# Patient Record
Sex: Male | Born: 1995 | Race: Black or African American | Hispanic: No | Marital: Single | Smoking: Current some day smoker
Health system: Southern US, Community
[De-identification: ages and names within clinical notes are randomized; demographics above are authoritative.]

---
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
## 2014-06-07 ENCOUNTER — Ambulatory Visit (INDEPENDENT_AMBULATORY_CARE_PROVIDER_SITE_OTHER): Payer: No Typology Code available for payment source | Admitting: Pediatrics

## 2014-06-07 ENCOUNTER — Encounter: Payer: Self-pay | Admitting: Pediatrics

## 2014-06-07 VITALS — BP 112/60 | Ht 65.0 in | Wt 135.6 lb

## 2014-06-07 DIAGNOSIS — Z0000 Encounter for general adult medical examination without abnormal findings: Secondary | ICD-10-CM

## 2014-06-07 DIAGNOSIS — L708 Other acne: Secondary | ICD-10-CM

## 2014-06-07 DIAGNOSIS — L700 Acne vulgaris: Secondary | ICD-10-CM

## 2014-06-07 DIAGNOSIS — Z00129 Encounter for routine child health examination without abnormal findings: Secondary | ICD-10-CM

## 2014-06-07 MED ORDER — BENZOYL PEROXIDE 10 % EX GEL: Freq: Every day | CUTANEOUS | Status: DC

## 2014-06-07 MED ORDER — TRETINOIN 0.05 % EX CREA: TOPICAL_CREAM | Freq: Every day | CUTANEOUS | Status: DC

## 2014-06-07 NOTE — Patient Instructions (Signed)
Acne  Acne is a skin problem that causes pimples. Acne occurs when the pores in your skin get blocked. Your pores may become red, sore, and swollen (inflamed), or infected with a common skin bacterium (Propionibacterium acnes). Acne is a common skin problem. Up to 80% of people get acne at some time. Acne is especially common from the ages of 12 to 24. Acne usually goes away over time with proper treatment.  CAUSES   Your pores each contain an oil gland. The oil glands make an oily substance called sebum. Acne happens when these glands get plugged with sebum, dead skin cells, and dirt. The P. acnes bacteria that are normally found in the oil glands then multiply, causing inflammation. Acne is commonly triggered by changes in your hormones. These hormonal changes can cause the oil glands to get bigger and to make more sebum. Factors that can make acne worse include:   Hormone changes during adolescence.   Hormone changes during women's menstrual cycles.   Hormone changes during pregnancy.   Oil-based cosmetics and hair products.   Harshly scrubbing the skin.   Strong soaps.   Stress.   Hormone problems due to certain diseases.   Long or oily hair rubbing against the skin.   Certain medicines.   Pressure from headbands, backpacks, or shoulder pads.   Exposure to certain oils and chemicals.  SYMPTOMS   Acne often occurs on the face, neck, chest, and upper back. Symptoms include:   Small, red bumps (pimples or papules).   Whiteheads (closed comedones).   Blackheads (open comedones).   Small, pus-filled pimples (pustules).   Big, red pimples or pustules that feel tender.  More severe acne can cause:   An infected area that contains a collection of pus (abscess).   Hard, painful, fluid-filled sacs (cysts).   Scars.  DIAGNOSIS   Your caregiver can usually tell what the problem is by doing a physical exam.  TREATMENT   There are many good treatments for acne. Some are available over the counter and some  are available with a prescription. The treatment that is best for you depends on the type of acne you have and how severe it is. It may take 2 months of treatment before your acne gets better. Common treatments include:   Creams and lotions that prevent oil glands from clogging.   Creams and lotions that treat or prevent infections and inflammation.   Antibiotics applied to the skin or taken as a pill.   Pills that decrease sebum production.   Birth control pills.   Light or laser treatments.   Minor surgery.   Injections of medicine into the affected areas.   Chemicals that cause peeling of the skin.  HOME CARE INSTRUCTIONS   Good skin care is the most important part of treatment.   Wash your skin gently at least twice a day and after exercise. Always wash your skin before bed.   Use mild soap.   After each wash, apply a water-based skin moisturizer.   Keep your hair clean and off of your face. Shampoo your hair daily.   Only take medicines as directed by your caregiver.   Use a sunscreen or sunblock with SPF 30 or greater. This is especially important when you are using acne medicines.   Choose cosmetics that are noncomedogenic. This means they do not plug the oil glands.   Avoid leaning your chin or forehead on your hands.   Avoid wearing tight headbands or hats.     Avoid picking or squeezing your pimples. This can make your acne worse and cause scarring.  SEEK MEDICAL CARE IF:    Your acne is not better after 8 weeks.   Your acne gets worse.   You have a large area of skin that is red or tender.  Document Released: 10/10/2000 Document Revised: 02/27/2014 Document Reviewed: 08/01/2011  ExitCare Patient Information 2015 [employer]. This information is not intended to replace advice given to you by your health care provider. Make sure you discuss any questions you have with your health care provider.

## 2014-06-07 NOTE — Progress Notes (Signed)
Subjective:     History was provided by the patient.  Sean Harding is a 18 y.o. male who is here for this well-child visit.  Immunization History  Administered Date(s) Administered  . DTaP 10/05/1995, 12/07/1995, 01/27/1996, 08/17/1996, 06/24/2000  . Hepatitis B 01/06/95, 12/07/1995, 08/17/1996  . HiB (PRP-OMP) 10/05/1995, 12/07/1995, 01/27/1996, 08/17/1996  . IPV 10/05/1995, 12/07/1995, 08/17/1996, 06/24/2000  . Influenza Whole 11/16/2006  . MMR 08/17/1996, 06/24/2000  . Pneumococcal Conjugate-13 06/24/1999  . Td 06/22/2007  . Tdap 06/22/2007  . Varicella 06/24/2000   The following portions of the patient's history were reviewed and updated as appropriate: allergies, current medications, past family history, past medical history, past social history, past surgical history and problem list.  Current Issues: Current concerns include acne. Currently menstruating? not applicable Sexually active? no  Does patient snore? no   Review of Nutrition: Current diet: Regular Balanced diet? yes  Social Screening:  Parental relations: Good  Discipline concerns? no Concerns regarding behavior with peers? no     Objective:     Filed Vitals:   06/07/14 1359  BP: 112/60  Height: 5' 5" (1.651 m)  Weight: 135 lb 9.6 oz (61.508 kg)   Growth parameters are noted and are appropriate for age.  General:   alert and cooperative  Gait:   normal  Skin:   Open and closed comedones face   Oral cavity:   lips, mucosa, and tongue normal; teeth and gums normal  Eyes:   sclerae white, pupils equal and reactive  Ears:   normal bilaterally  Neck:   no adenopathy, supple, symmetrical, trachea midline and thyroid not enlarged, symmetric, no tenderness/mass/nodules  Lungs:  clear to auscultation bilaterally  Heart:   regular rate and rhythm, S1, S2 normal, no murmur, click, rub or gallop  Abdomen:  soft, non-tender; bowel sounds normal; no masses,  no organomegaly  GU:  exam deferred   Tanner Stage:   5   Extremities:  extremities normal, atraumatic, no cyanosis or edema  Neuro:  normal without focal findings, mental status, speech normal, alert and oriented x3 and PERLA     Assessment:    Well adolescent.    Plan:    1. Anticipatory guidance discussed. Gave handout on well-child issues at this age.  2.  Weight management:  The patient was counseled regarding nutrition and physical activity.  3. Development: appropriate for age  59. Immunizations today: per orders. History of previous adverse reactions to immunizations? no  5. Follow-up visit in 1 year for next well child visit, or sooner as needed.   6. Prescribed 2 medications for acne  7. Does not want immunizations today as he is alone and will talk to mother

## 2014-06-13 ENCOUNTER — Other Ambulatory Visit: Payer: Self-pay | Admitting: *Deleted

## 2014-06-13 NOTE — Telephone Encounter (Signed)
Medication change on Benzoyl peroxide gel max 28gm

## 2015-04-29 ENCOUNTER — Encounter (HOSPITAL_COMMUNITY): Payer: Self-pay | Admitting: *Deleted

## 2015-04-29 ENCOUNTER — Emergency Department (HOSPITAL_COMMUNITY)
Admission: EM | Admit: 2015-04-29 | Discharge: 2015-04-29 | Disposition: A | Discharge status: Home / Self Care | Payer: Medicaid Other | Attending: Emergency Medicine | Admitting: Emergency Medicine

## 2015-04-29 DIAGNOSIS — Y998 Other external cause status: Secondary | ICD-10-CM | POA: Diagnosis not present

## 2015-04-29 DIAGNOSIS — S3991XA Unspecified injury of abdomen, initial encounter: Secondary | ICD-10-CM | POA: Insufficient documentation

## 2015-04-29 DIAGNOSIS — Y9355 Activity, bike riding: Secondary | ICD-10-CM | POA: Diagnosis not present

## 2015-04-29 DIAGNOSIS — S30810A Abrasion of lower back and pelvis, initial encounter: Secondary | ICD-10-CM | POA: Insufficient documentation

## 2015-04-29 DIAGNOSIS — S3992XA Unspecified injury of lower back, initial encounter: Secondary | ICD-10-CM | POA: Diagnosis present

## 2015-04-29 DIAGNOSIS — Z79899 Other long term (current) drug therapy: Secondary | ICD-10-CM | POA: Diagnosis not present

## 2015-04-29 DIAGNOSIS — Y92410 Unspecified street and highway as the place of occurrence of the external cause: Secondary | ICD-10-CM | POA: Insufficient documentation

## 2015-04-29 MED ORDER — BACITRACIN ZINC 500 UNIT/GM EX OINT
TOPICAL_OINTMENT | CUTANEOUS | Status: AC
  Filled 2015-04-29: qty 0.9

## 2015-04-29 MED ORDER — BACITRACIN 500 UNIT/GM EX OINT
1.0000 "application " | TOPICAL_OINTMENT | Freq: Two times a day (BID) | CUTANEOUS | Status: DC
  Administered 2015-04-29: 1 via TOPICAL
  Filled 2015-04-29 (×3): qty 0.9

## 2015-04-29 MED ORDER — NAPROXEN 500 MG PO TABS: 500.0000 mg | ORAL_TABLET | Freq: Two times a day (BID) | ORAL | Status: DC

## 2015-04-29 NOTE — ED Notes (Signed)
PA Tammy Triplett at bedside. 

## 2015-04-29 NOTE — Discharge Instructions (Signed)
Abrasions An abrasion is a cut or scrape of the skin. Abrasions do not go through all layers of the skin. HOME CARE  If a bandage (dressing) was put on your wound, change it as told by your doctor. If the bandage sticks, soak it off with warm.  Wash the area with water and soap 2 times a day. Rinse off the soap. Pat the area dry with a clean towel.  Put on medicated cream (ointment) as told by your doctor.  Change your bandage right away if it gets wet or dirty.  Only take medicine as told by your doctor.  See your doctor within 24-48 hours to get your wound checked.  Check your wound for redness, puffiness (swelling), or yellowish-white fluid (pus). GET HELP RIGHT AWAY IF:   You have more pain in the wound.  You have redness, swelling, or tenderness around the wound.  You have pus coming from the wound.  You have a fever or lasting symptoms for more than 2-3 days.  You have a fever and your symptoms suddenly get worse.  You have a bad smell coming from the wound or bandage. MAKE SURE YOU:   Understand these instructions.  Will watch your condition.  Will get help right away if you are not doing well or get worse. Document Released: 03/31/2008 Document Revised: 07/07/2012 Document Reviewed: 09/16/2011 ExitCare Patient Information 2015 [employer]. This information is not intended to replace advice given to you by your health care provider. Make sure you discuss any questions you have with your health care provider.  
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
## 2015-04-29 NOTE — ED Notes (Signed)
Applied bacitracin and xeroform to pt's abrasion. Pt tolerated well.

## 2015-08-22 ENCOUNTER — Encounter (HOSPITAL_COMMUNITY): Payer: Self-pay | Admitting: Emergency Medicine

## 2015-08-22 ENCOUNTER — Emergency Department (HOSPITAL_COMMUNITY)
Admission: EM | Admit: 2015-08-22 | Discharge: 2015-08-22 | Disposition: A | Discharge status: Home / Self Care | Payer: No Typology Code available for payment source | Attending: Emergency Medicine | Admitting: Emergency Medicine

## 2015-08-22 DIAGNOSIS — Y9389 Activity, other specified: Secondary | ICD-10-CM | POA: Diagnosis not present

## 2015-08-22 DIAGNOSIS — Y92410 Unspecified street and highway as the place of occurrence of the external cause: Secondary | ICD-10-CM | POA: Diagnosis not present

## 2015-08-22 DIAGNOSIS — S4991XA Unspecified injury of right shoulder and upper arm, initial encounter: Secondary | ICD-10-CM | POA: Diagnosis not present

## 2015-08-22 DIAGNOSIS — S39012A Strain of muscle, fascia and tendon of lower back, initial encounter: Secondary | ICD-10-CM | POA: Diagnosis not present

## 2015-08-22 DIAGNOSIS — Y998 Other external cause status: Secondary | ICD-10-CM | POA: Diagnosis not present

## 2015-08-22 DIAGNOSIS — S3992XA Unspecified injury of lower back, initial encounter: Secondary | ICD-10-CM | POA: Diagnosis present

## 2015-08-22 MED ORDER — IBUPROFEN 600 MG PO TABS: 600.0000 mg | ORAL_TABLET | Freq: Four times a day (QID) | ORAL | Status: DC | PRN

## 2015-08-22 MED ORDER — METHOCARBAMOL 500 MG PO TABS: 500.0000 mg | ORAL_TABLET | Freq: Three times a day (TID) | ORAL | Status: DC
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
## 2015-08-22 NOTE — Discharge Instructions (Signed)
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
## 2016-07-06 ENCOUNTER — Ambulatory Visit (INDEPENDENT_AMBULATORY_CARE_PROVIDER_SITE_OTHER): Payer: Self-pay

## 2016-07-06 ENCOUNTER — Encounter (HOSPITAL_COMMUNITY): Payer: Self-pay | Admitting: Family Medicine

## 2016-07-06 ENCOUNTER — Ambulatory Visit (HOSPITAL_COMMUNITY)
Admission: EM | Admit: 2016-07-06 | Discharge: 2016-07-06 | Disposition: A | Discharge status: Home / Self Care | Payer: Self-pay | Attending: Family Medicine | Admitting: Family Medicine

## 2016-07-06 DIAGNOSIS — S63502A Unspecified sprain of left wrist, initial encounter: Secondary | ICD-10-CM
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
## 2016-07-06 NOTE — Discharge Instructions (Signed)
X-rays of the wrist are negative. The diagnosis is a wrist sprain. Keep the wrist in splint for the next 7 days except when bathing. He wrist should feel normal when it comes off next Sunday, otherwise follow-up with your personal doctor.

## 2017-06-28 ENCOUNTER — Other Ambulatory Visit: Payer: Self-pay

## 2017-06-28 ENCOUNTER — Emergency Department (HOSPITAL_COMMUNITY)
Admission: EM | Admit: 2017-06-28 | Discharge: 2017-06-28 | Disposition: A | Discharge status: Home / Self Care | Payer: BLUE CROSS/BLUE SHIELD | Attending: Emergency Medicine | Admitting: Emergency Medicine

## 2017-06-28 ENCOUNTER — Encounter (HOSPITAL_COMMUNITY): Payer: Self-pay | Admitting: *Deleted

## 2017-06-28 ENCOUNTER — Emergency Department (HOSPITAL_COMMUNITY): Payer: BLUE CROSS/BLUE SHIELD

## 2017-06-28 DIAGNOSIS — R0789 Other chest pain: Secondary | ICD-10-CM | POA: Diagnosis not present

## 2017-06-28 DIAGNOSIS — F17200 Nicotine dependence, unspecified, uncomplicated: Secondary | ICD-10-CM | POA: Insufficient documentation

## 2017-06-28 DIAGNOSIS — R079 Chest pain, unspecified: Secondary | ICD-10-CM | POA: Diagnosis present

## 2017-06-28 LAB — BASIC METABOLIC PANEL
Anion gap: 8 (ref 5–15)
BUN: 17 mg/dL (ref 6–20)
CALCIUM: 9.3 mg/dL (ref 8.9–10.3)
CO2: 26 mmol/L (ref 22–32)
CREATININE: 0.91 mg/dL (ref 0.61–1.24)
Chloride: 105 mmol/L (ref 101–111)
GFR calc Af Amer: >60 mL/min (ref >60)
GLUCOSE: 91 mg/dL (ref 65–99)
Potassium: 4.1 mmol/L (ref 3.5–5.1)
SODIUM: 139 mmol/L (ref 135–145)

## 2017-06-28 LAB — D-DIMER, QUANTITATIVE: D-Dimer, Quant: <0.27 ug/mL-FEU (ref 0.00–0.50)

## 2017-06-28 LAB — CBC
HCT: 40.2 % (ref 39.0–52.0)
HEMOGLOBIN: 14 g/dL (ref 13.0–17.0)
MCH: 31.3 pg (ref 26.0–34.0)
MCHC: 34.8 g/dL (ref 30.0–36.0)
MCV: 89.7 fL (ref 78.0–100.0)
PLATELETS: 305 10*3/uL (ref 150–400)
RBC: 4.48 MIL/uL (ref 4.22–5.81)
RDW: 11.8 % (ref 11.5–15.5)
WBC: 4.5 10*3/uL (ref 4.0–10.5)

## 2017-06-28 LAB — TROPONIN I: Troponin I: <0.03 ng/mL (ref <0.03)

## 2017-06-28 MED ORDER — METHOCARBAMOL 500 MG PO TABS: 1000.0000 mg | ORAL_TABLET | Freq: Four times a day (QID) | ORAL | 0 refills | Status: DC | PRN

## 2017-06-28 MED ORDER — ACETAMINOPHEN 325 MG PO TABS
650.0000 mg | ORAL_TABLET | Freq: Once | ORAL | Status: AC
  Administered 2017-06-28: 650 mg via ORAL
  Filled 2017-06-28: qty 2

## 2017-06-28 MED ORDER — IBUPROFEN 400 MG PO TABS
400.0000 mg | ORAL_TABLET | Freq: Once | ORAL | Status: AC
  Administered 2017-06-28: 400 mg via ORAL
  Filled 2017-06-28: qty 1

## 2017-06-28 MED ORDER — NAPROXEN 250 MG PO TABS: 250.0000 mg | ORAL_TABLET | Freq: Two times a day (BID) | ORAL | 0 refills | Status: DC | PRN

## 2017-06-28 NOTE — Discharge Instructions (Signed)
Take the prescriptions as directed. May also take over the counter tylenol, as directed on packaging, as needed for discomfort.  Apply moist heat or ice to the area(s) of discomfort, for 15 minutes at a time, several times per day for the next few days.  Do not fall asleep on a heating or ice pack.  Call your regular medical doctor on Tuesday to schedule a follow up appointment this week.  Return to the Emergency Department immediately if worsening.

## 2017-06-28 NOTE — ED Provider Notes (Signed)
[facility]EM[facility]ENCY DEPT Provider Note   CSN: [id number] Arrival date & time: 06/28/17  1345     History   Chief Complaint Chief Complaint  Patient presents with  . Chest Pain    HPI Sean Harding is a 21 y.o. male.  HPI  Pt was seen at 1620. Per pt, c/o gradual onset and persistence of constant left sided chest "pain" for the past 3 days. Describes the CP as "stabbing" and "sore," constant, worsens with movement of his left arm and palpation of the area. Denies any other symptoms. Denies injury, no SOB/cough, no palpitations, no fevers, no abd pain, no N/V/D, no back pain, no rash.   History reviewed. No pertinent past medical history.  Patient Active Problem List   Diagnosis Date Noted  . 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 FRACTURE, RADIUS, DISTAL 06/27/2008    History reviewed. No pertinent surgical history.     Home Medications    Prior to Admission medications   Not on File    Family History Family History  Problem Relation Age of Onset  . Heart attack Other        Great Aunt: death/late 40's  . Heart attack Other        Great Grandmother: death/late 55's    Social History Social History  Substance Use Topics  . Smoking status: Current Some Day Smoker  . Smokeless tobacco: Never Used  . Alcohol use Yes     Comment: sometimes     Allergies   Bee venom   Review of Systems Review of Systems ROS: Statement: All systems negative except as marked or noted in the HPI; Constitutional: Negative for fever and chills. ; ; Eyes: Negative for eye pain, redness and discharge. ; ; ENMT: Negative for ear pain, hoarseness, nasal congestion, sinus pressure and sore throat. ; ; Cardiovascular: Negative for palpitations, diaphoresis, dyspnea and peripheral edema. ; ; Respiratory: Negative for cough, wheezing and stridor. ; ; Gastrointestinal: Negative for nausea, vomiting, diarrhea, abdominal pain, blood in stool, hematemesis, jaundice and rectal bleeding. . ; ;  Genitourinary: Negative for dysuria, flank pain and hematuria. ; ; Musculoskeletal: +chest wall pain. Negative for back pain and neck pain. Negative for swelling and trauma.; ; Skin: Negative for pruritus, rash, abrasions, blisters, bruising and skin lesion.; ; Neuro: Negative for headache, lightheadedness and neck stiffness. Negative for weakness, altered level of consciousness, altered mental status, extremity weakness, paresthesias, involuntary movement, seizure and syncope.      Physical Exam Updated Vital Signs BP 126/74 (BP Location: Right Arm)   Pulse 64   Temp 98.3 F (36.8 C) (Oral)   Resp 18   Ht 5\' 5"  (1.651 m)   Wt 63.5 kg (140 lb)   SpO2 100%   BMI 23.30 kg/m   Physical Exam 1625: Physical examination:  Nursing notes reviewed; Vital signs and O2 SAT reviewed;  Constitutional: Well developed, Well nourished, Well hydrated, In no acute distress; Head:  Normocephalic, atraumatic; Eyes: EOMI, PERRL, No scleral icterus; ENMT: Mouth and pharynx normal, Mucous membranes moist; Neck: Supple, Full range of motion, No lymphadenopathy; Cardiovascular: Regular rate and rhythm, No gallop; Respiratory: Breath sounds clear & equal bilaterally, No wheezes.  Speaking full sentences with ease, Normal respiratory effort/excursion; Chest: +TTP left parasternal and medial chest wall areas which reproduce pt's symptoms. No rash, no deformity, no soft tissue crepitus. Movement normal; Abdomen: Soft, Nontender, Nondistended, Normal bowel sounds; Genitourinary: No CVA tenderness; Extremities: Pulses normal, No tenderness, No edema, No calf edema or  asymmetry.; Neuro: AA&Ox3, Major CN grossly intact.  Speech clear. No gross focal motor or sensory deficits in extremities.; Skin: Color normal, Warm, Dry.   ED Treatments / Results  Labs (all labs ordered are listed, but only abnormal results are displayed)   EKG  EKG Interpretation  Date/Time:  Sunday June 28 2017 13:57:26 EDT Ventricular Rate:    68 PR Interval:  152 QRS Duration: 90 QT Interval:  344 QTC Calculation: 365 R Axis:   90 Text Interpretation:  Normal sinus rhythm Rightward axis Moderate voltage criteria for LVH, may be normal variant Cannot rule out Septal infarct , age undetermined No old tracing to compare Confirmed by Samuel JesterMcManus, Donte Lenzo [phone]([id number]) on 06/28/2017 4:21:14 PM       Radiology   Procedures Procedures (including critical care time)  Medications Ordered in ED Medications  acetaminophen (TYLENOL) tablet 650 mg (not administered)  ibuprofen (ADVIL,MOTRIN) tablet 400 mg (not administered)     Initial Impression / Assessment and Plan / ED Course  I have reviewed the triage vital signs and the nursing notes.  Pertinent labs & imaging results that were available during my care of the patient were reviewed by me and considered in my medical decision making (see chart for details).  MDM Reviewed: previous chart, nursing note and vitals Interpretation: labs, ECG and x-ray   Results for orders placed or performed during the hospital encounter of 06/28/17  Basic metabolic panel  Result Value Ref Range   Sodium 139 135 - 145 mmol/L   Potassium 4.1 3.5 - 5.1 mmol/L   Chloride 105 101 - 111 mmol/L   CO2 26 22 - 32 mmol/L   Glucose, Bld 91 65 - 99 mg/dL   BUN 17 6 - 20 mg/dL   Creatinine, Ser 1.910.91 0.61 - 1.24 mg/dL   Calcium 9.3 8.9 - 47.810.3 mg/dL   GFR calc non Af Amer >60 >60 mL/min   GFR calc Af Amer >60 >60 mL/min   Anion gap 8 5 - 15  CBC  Result Value Ref Range   WBC 4.5 4.0 - 10.5 K/uL   RBC 4.48 4.22 - 5.81 MIL/uL   Hemoglobin 14.0 13.0 - 17.0 g/dL   HCT 29.540.2 62.139.0 - 30.852.0 %   MCV 89.7 78.0 - 100.0 fL   MCH 31.3 26.0 - 34.0 pg   MCHC 34.8 30.0 - 36.0 g/dL   RDW 65.711.8 84.611.5 - 96.215.5 %   Platelets 305 150 - 400 K/uL  Troponin I  Result Value Ref Range   Troponin I <0.03 <0.03 ng/mL  Troponin I  Result Value Ref Range   Troponin I <0.03 <0.03 ng/mL  D-dimer, quantitative  Result Value Ref  Range   D-Dimer, Quant <0.27 0.00 - 0.50 ug/mL-FEU   Dg Chest 2 View Result Date: 06/28/2017 CLINICAL DATA:  21 year old with three-day history of left-sided chest pain. Current smoker. EXAM: CHEST  2 VIEW COMPARISON:  06/05/2010, 11/21/2007. FINDINGS: Cardiomediastinal silhouette unremarkable. Lungs clear. Bronchovascular markings normal. Pulmonary vascularity normal. No pneumothorax. No pleural effusions. Visualized bony thorax intact. IMPRESSION: Normal examination. Electronically Signed   By: Hulan Saashomas  Lawrence M.D.   On: 06/28/2017 14:07    1725:  Doubt PE as cause for symptoms with normal d-dimer and low risk [place].  Doubt ACS as cause for symptoms with normal troponin x2 and EKG without acute STTW changes after 3 days of constant symptoms. Pt feels better after tylenol and motrin and wants to go home now. Dx and testing d/w pt.  Questions answered.  Verb understanding, agreeable to d/c home with outpt f/u.    Final Clinical Impressions(s) / ED Diagnoses   Final diagnoses:  None    New Prescriptions New Prescriptions   No medications on file     Samuel Jester, DO 07/01/17 2122

## 2017-06-28 NOTE — ED Triage Notes (Signed)
Pt c/o left sided chest pain x 3 days. Denies SOB, n/v, dizziness.

## 2018-05-30 IMAGING — DX DG CHEST 2V
2 series · 2 of 2 positions shown · non-contrast
Comparison: 06/05/2010, 11/21/2007.

CLINICAL DATA: 20-year-old with three-day history of left-sided
chest pain. Current smoker.

EXAM:
CHEST  2 VIEW

[chest pa]
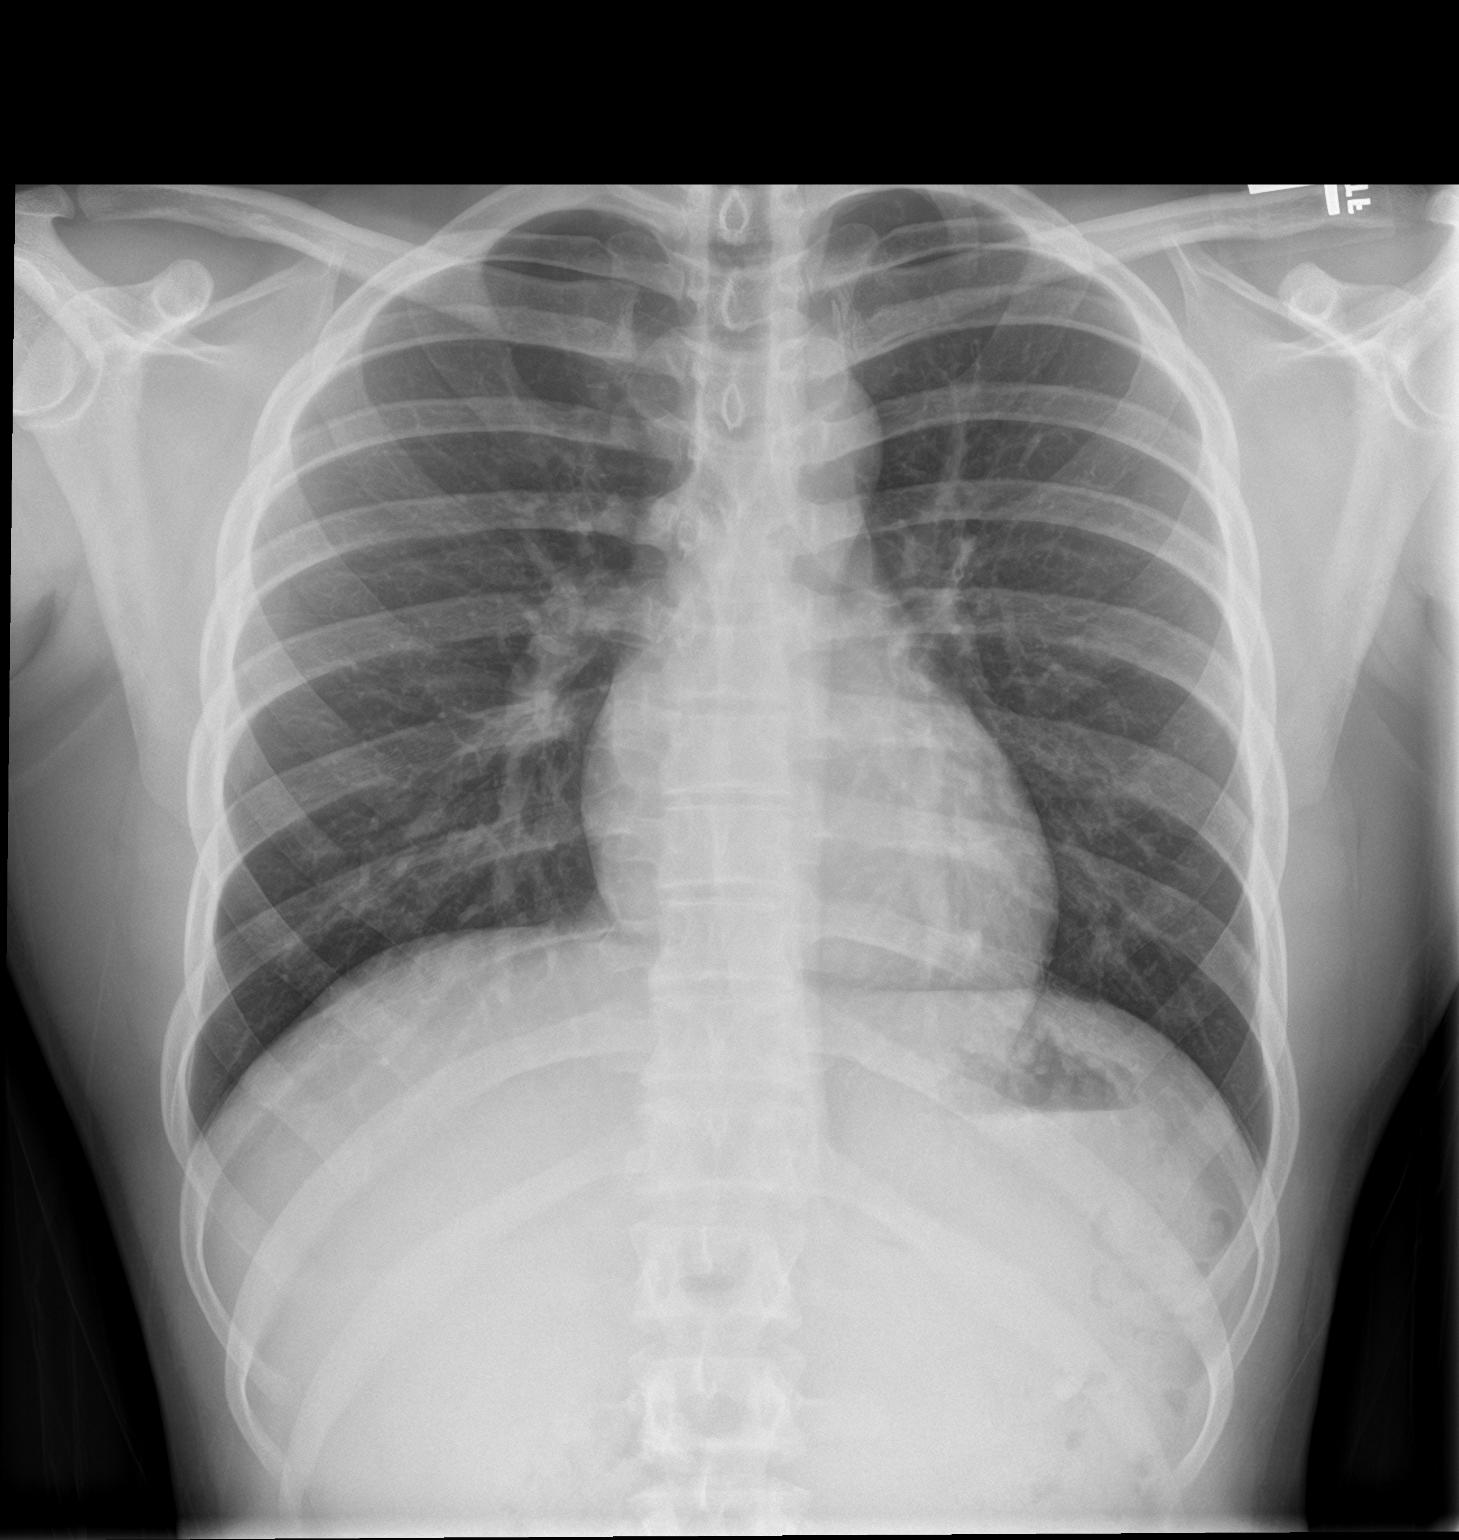

[chest lat]
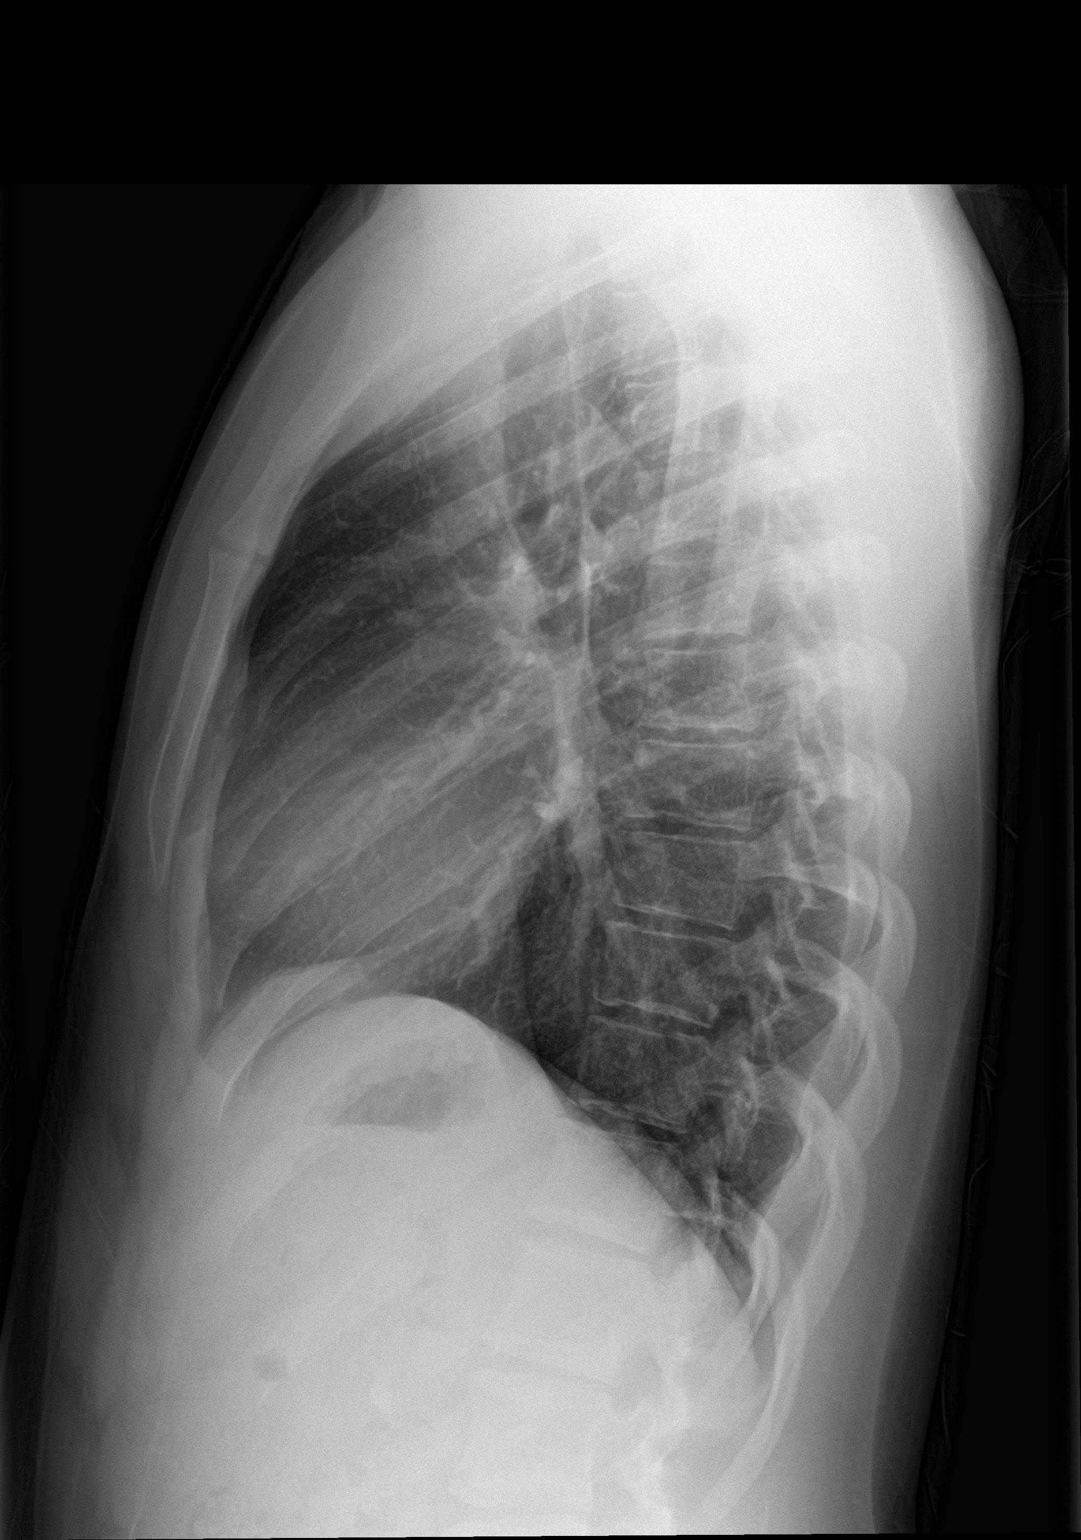

[2 of 2 positions shown; findings below may reference images not displayed]

FINDINGS: Cardiomediastinal silhouette unremarkable. Lungs clear.
Bronchovascular markings normal. Pulmonary vascularity normal. No
pneumothorax. No pleural effusions. Visualized bony thorax intact.
IMPRESSION: Normal examination.

## 2023-05-11 ENCOUNTER — Other Ambulatory Visit: Payer: Self-pay

## 2023-05-11 ENCOUNTER — Observation Stay (HOSPITAL_COMMUNITY)
Admission: EM | Admit: 2023-05-11 | Discharge: 2023-05-12 | Disposition: A | Discharge status: Home / Self Care | Payer: Commercial Managed Care - HMO | Attending: General Surgery | Admitting: General Surgery

## 2023-05-11 DIAGNOSIS — R1031 Right lower quadrant pain: Secondary | ICD-10-CM | POA: Diagnosis present

## 2023-05-11 DIAGNOSIS — K3580 Unspecified acute appendicitis: Secondary | ICD-10-CM | POA: Diagnosis not present

## 2023-05-11 DIAGNOSIS — F17200 Nicotine dependence, unspecified, uncomplicated: Secondary | ICD-10-CM | POA: Insufficient documentation

## 2023-05-11 NOTE — ED Triage Notes (Signed)
Pt c/o lower abdominal pain that started yesterday. Believes he may be constipated as he has not had a normal bowel movement since Friday. States he has taken stool softeners and a laxative today with no relief.

## 2023-05-12 ENCOUNTER — Emergency Department (HOSPITAL_COMMUNITY): Payer: Commercial Managed Care - HMO | Admitting: Anesthesiology

## 2023-05-12 ENCOUNTER — Emergency Department (HOSPITAL_COMMUNITY): Payer: Commercial Managed Care - HMO

## 2023-05-12 ENCOUNTER — Emergency Department (HOSPITAL_BASED_OUTPATIENT_CLINIC_OR_DEPARTMENT_OTHER): Payer: Commercial Managed Care - HMO | Admitting: Anesthesiology

## 2023-05-12 ENCOUNTER — Encounter (HOSPITAL_COMMUNITY)
Admission: EM | Disposition: A | Discharge status: Home / Self Care | Payer: Self-pay | Source: Home / Self Care | Attending: Emergency Medicine

## 2023-05-12 ENCOUNTER — Encounter (HOSPITAL_COMMUNITY): Payer: Self-pay

## 2023-05-12 ENCOUNTER — Other Ambulatory Visit: Payer: Self-pay

## 2023-05-12 DIAGNOSIS — K353 Acute appendicitis with localized peritonitis, without perforation or gangrene: Secondary | ICD-10-CM | POA: Diagnosis not present

## 2023-05-12 DIAGNOSIS — K3580 Unspecified acute appendicitis: Principal | ICD-10-CM

## 2023-05-12 HISTORY — PX: XI ROBOTIC LAPAROSCOPIC ASSISTED APPENDECTOMY: SHX6877

## 2023-05-12 LAB — COMPREHENSIVE METABOLIC PANEL
ALT: 11 U/L (ref 0–44)
AST: 23 U/L (ref 15–41)
Albumin: 4.3 g/dL (ref 3.5–5.0)
Alkaline Phosphatase: 67 U/L (ref 38–126)
Anion gap: 9 (ref 5–15)
BUN: 11 mg/dL (ref 6–20)
CO2: 26 mmol/L (ref 22–32)
Calcium: 9.4 mg/dL (ref 8.9–10.3)
Chloride: 101 mmol/L (ref 98–111)
Creatinine, Ser: 0.92 mg/dL (ref 0.61–1.24)
GFR, Estimated: >60 mL/min (ref >60)
Glucose, Bld: 110 mg/dL — ABNORMAL HIGH (ref 70–99)
Potassium: 3.6 mmol/L (ref 3.5–5.1)
Sodium: 136 mmol/L (ref 135–145)
Total Bilirubin: 0.9 mg/dL (ref 0.3–1.2)
Total Protein: 7.8 g/dL (ref 6.5–8.1)

## 2023-05-12 LAB — CBC WITH DIFFERENTIAL/PLATELET
Abs Immature Granulocytes: 0.02 10*3/uL (ref 0.00–0.07)
Basophils Absolute: 0 10*3/uL (ref 0.0–0.1)
Basophils Relative: 0 %
Eosinophils Absolute: 0.2 10*3/uL (ref 0.0–0.5)
Eosinophils Relative: 2 %
HCT: 41.3 % (ref 39.0–52.0)
Hemoglobin: 13.8 g/dL (ref 13.0–17.0)
Immature Granulocytes: 0 %
Lymphocytes Relative: 9 %
Lymphs Abs: 0.9 10*3/uL (ref 0.7–4.0)
MCH: 30.6 pg (ref 26.0–34.0)
MCHC: 33.4 g/dL (ref 30.0–36.0)
MCV: 91.6 fL (ref 80.0–100.0)
Monocytes Absolute: 0.5 10*3/uL (ref 0.1–1.0)
Monocytes Relative: 6 %
Neutro Abs: 8.1 10*3/uL — ABNORMAL HIGH (ref 1.7–7.7)
Neutrophils Relative %: 83 %
Platelets: 275 10*3/uL (ref 150–400)
RBC: 4.51 MIL/uL (ref 4.22–5.81)
RDW: 11.5 % (ref 11.5–15.5)
WBC: 9.7 10*3/uL (ref 4.0–10.5)
nRBC: 0 % (ref 0.0–0.2)

## 2023-05-12 LAB — LIPASE, BLOOD: Lipase: 24 U/L (ref 11–51)

## 2023-05-12 SURGERY — APPENDECTOMY, ROBOT-ASSISTED, LAPAROSCOPIC
Anesthesia: General | Site: Abdomen

## 2023-05-12 MED ORDER — FENTANYL CITRATE (PF) 100 MCG/2ML IJ SOLN
INTRAMUSCULAR | Status: AC
  Filled 2023-05-12: qty 2

## 2023-05-12 MED ORDER — KETOROLAC TROMETHAMINE 30 MG/ML IJ SOLN: 30.0000 mg | Freq: Four times a day (QID) | INTRAMUSCULAR | Status: DC | PRN

## 2023-05-12 MED ORDER — SUGAMMADEX SODIUM 200 MG/2ML IV SOLN
INTRAVENOUS | Status: DC | PRN
  Administered 2023-05-12: 200 mg via INTRAVENOUS

## 2023-05-12 MED ORDER — DEXAMETHASONE SODIUM PHOSPHATE 10 MG/ML IJ SOLN
INTRAMUSCULAR | Status: DC | PRN
  Administered 2023-05-12: 10 mg via INTRAVENOUS

## 2023-05-12 MED ORDER — ROCURONIUM BROMIDE 100 MG/10ML IV SOLN
INTRAVENOUS | Status: DC | PRN
  Administered 2023-05-12: 60 mg via INTRAVENOUS
  Administered 2023-05-12 (×2): 10 mg via INTRAVENOUS

## 2023-05-12 MED ORDER — ACETAMINOPHEN 325 MG PO TABS: 650.0000 mg | ORAL_TABLET | Freq: Four times a day (QID) | ORAL | Status: DC | PRN

## 2023-05-12 MED ORDER — SODIUM CHLORIDE 0.9 % IR SOLN
Status: DC | PRN
  Administered 2023-05-12: 3000 mL

## 2023-05-12 MED ORDER — KETOROLAC TROMETHAMINE 30 MG/ML IJ SOLN: 30.0000 mg | Freq: Four times a day (QID) | INTRAMUSCULAR | Status: DC

## 2023-05-12 MED ORDER — DEXMEDETOMIDINE HCL IN NACL 80 MCG/20ML IV SOLN
INTRAVENOUS | Status: DC | PRN
  Administered 2023-05-12 (×2): 8 ug via INTRAVENOUS
  Administered 2023-05-12: 4 ug via INTRAVENOUS

## 2023-05-12 MED ORDER — ONDANSETRON 4 MG PO TBDP: 4.0000 mg | ORAL_TABLET | Freq: Four times a day (QID) | ORAL | Status: DC | PRN

## 2023-05-12 MED ORDER — PROPOFOL 10 MG/ML IV BOLUS
INTRAVENOUS | Status: DC | PRN
Start: 2023-05-12 — End: 2023-05-12
  Administered 2023-05-12: 25 mg via INTRAVENOUS
  Administered 2023-05-12: 175 mg via INTRAVENOUS
  Administered 2023-05-12: 50 mg via INTRAVENOUS
  Administered 2023-05-12: 25 mg via INTRAVENOUS

## 2023-05-12 MED ORDER — ALBUMIN HUMAN 5 % IV SOLN
INTRAVENOUS | Status: AC
  Filled 2023-05-12: qty 250

## 2023-05-12 MED ORDER — LACTATED RINGERS IV BOLUS
1000.0000 mL | Freq: Once | INTRAVENOUS | Status: AC
  Administered 2023-05-12: 1000 mL via INTRAVENOUS

## 2023-05-12 MED ORDER — ONDANSETRON HCL 4 MG/2ML IJ SOLN
4.0000 mg | Freq: Four times a day (QID) | INTRAMUSCULAR | Status: DC | PRN
  Administered 2023-05-12: 4 mg via INTRAVENOUS
  Filled 2023-05-12: qty 2

## 2023-05-12 MED ORDER — ACETAMINOPHEN 650 MG RE SUPP: 650.0000 mg | Freq: Four times a day (QID) | RECTAL | Status: DC | PRN

## 2023-05-12 MED ORDER — ONDANSETRON HCL 4 MG/2ML IJ SOLN
INTRAMUSCULAR | Status: DC | PRN
  Administered 2023-05-12: 4 mg via INTRAVENOUS

## 2023-05-12 MED ORDER — KETOROLAC TROMETHAMINE 30 MG/ML IJ SOLN
30.0000 mg | Freq: Once | INTRAMUSCULAR | Status: AC
  Administered 2023-05-12: 30 mg via INTRAVENOUS
  Filled 2023-05-12: qty 1

## 2023-05-12 MED ORDER — LIDOCAINE HCL 1 % IJ SOLN
INTRAMUSCULAR | Status: DC | PRN
  Administered 2023-05-12: 100 mg via INTRADERMAL

## 2023-05-12 MED ORDER — METRONIDAZOLE 500 MG/100ML IV SOLN
500.0000 mg | Freq: Once | INTRAVENOUS | Status: AC
  Administered 2023-05-12: 500 mg via INTRAVENOUS
  Filled 2023-05-12: qty 100

## 2023-05-12 MED ORDER — HYDROMORPHONE HCL 1 MG/ML IJ SOLN
0.2500 mg | INTRAMUSCULAR | Status: DC | PRN
  Administered 2023-05-12: 0.5 mg via INTRAVENOUS
  Filled 2023-05-12: qty 0.5

## 2023-05-12 MED ORDER — METRONIDAZOLE 500 MG/100ML IV SOLN
500.0000 mg | Freq: Two times a day (BID) | INTRAVENOUS | Status: DC
  Administered 2023-05-12: 500 mg via INTRAVENOUS
  Filled 2023-05-12: qty 100

## 2023-05-12 MED ORDER — LACTATED RINGERS IV SOLN: INTRAVENOUS | Status: DC

## 2023-05-12 MED ORDER — ONDANSETRON HCL 4 MG/2ML IJ SOLN: 4.0000 mg | Freq: Four times a day (QID) | INTRAMUSCULAR | Status: DC | PRN

## 2023-05-12 MED ORDER — SODIUM CHLORIDE 0.9 % IV SOLN: 2.0000 g | INTRAVENOUS | Status: DC

## 2023-05-12 MED ORDER — CHLORHEXIDINE GLUCONATE 0.12 % MT SOLN
15.0000 mL | Freq: Once | OROMUCOSAL | Status: AC
  Administered 2023-05-12: 15 mL via OROMUCOSAL
  Filled 2023-05-12: qty 15

## 2023-05-12 MED ORDER — ROCURONIUM BROMIDE 10 MG/ML (PF) SYRINGE
PREFILLED_SYRINGE | INTRAVENOUS | Status: AC
  Filled 2023-05-12: qty 10

## 2023-05-12 MED ORDER — PROPOFOL 10 MG/ML IV BOLUS
INTRAVENOUS | Status: AC
  Filled 2023-05-12: qty 20

## 2023-05-12 MED ORDER — MIDAZOLAM HCL 2 MG/2ML IJ SOLN
INTRAMUSCULAR | Status: AC
  Filled 2023-05-12: qty 2

## 2023-05-12 MED ORDER — EPHEDRINE SULFATE-NACL 50-0.9 MG/10ML-% IV SOSY
PREFILLED_SYRINGE | INTRAVENOUS | Status: DC | PRN
  Administered 2023-05-12 (×2): 10 mg via INTRAVENOUS

## 2023-05-12 MED ORDER — HYDROMORPHONE HCL 1 MG/ML IJ SOLN: 1.0000 mg | INTRAMUSCULAR | Status: DC | PRN

## 2023-05-12 MED ORDER — MORPHINE SULFATE (PF) 4 MG/ML IV SOLN
4.0000 mg | INTRAVENOUS | Status: DC | PRN
  Administered 2023-05-12: 4 mg via INTRAVENOUS
  Filled 2023-05-12: qty 1

## 2023-05-12 MED ORDER — SODIUM CHLORIDE 0.9 % IV SOLN
2.0000 g | Freq: Once | INTRAVENOUS | Status: AC
  Administered 2023-05-12: 2 g via INTRAVENOUS
  Filled 2023-05-12: qty 20

## 2023-05-12 MED ORDER — IOHEXOL 300 MG/ML  SOLN
100.0000 mL | Freq: Once | INTRAMUSCULAR | Status: AC | PRN
  Administered 2023-05-12: 100 mL via INTRAVENOUS

## 2023-05-12 MED ORDER — MEPERIDINE HCL 50 MG/ML IJ SOLN: 6.2500 mg | INTRAMUSCULAR | Status: DC | PRN

## 2023-05-12 MED ORDER — PHENYLEPHRINE 80 MCG/ML (10ML) SYRINGE FOR IV PUSH (FOR BLOOD PRESSURE SUPPORT)
PREFILLED_SYRINGE | INTRAVENOUS | Status: AC
  Filled 2023-05-12: qty 10

## 2023-05-12 MED ORDER — STERILE WATER FOR IRRIGATION IR SOLN
Status: DC | PRN
  Administered 2023-05-12: 1000 mL

## 2023-05-12 MED ORDER — OXYCODONE HCL 5 MG PO TABS: 5.0000 mg | ORAL_TABLET | ORAL | 0 refills | Status: AC | PRN

## 2023-05-12 MED ORDER — DEXAMETHASONE SODIUM PHOSPHATE 10 MG/ML IJ SOLN
INTRAMUSCULAR | Status: AC
  Filled 2023-05-12: qty 1

## 2023-05-12 MED ORDER — METRONIDAZOLE 500 MG/100ML IV SOLN: 500.0000 mg | Freq: Two times a day (BID) | INTRAVENOUS | Status: DC

## 2023-05-12 MED ORDER — BUPIVACAINE HCL (PF) 0.5 % IJ SOLN
INTRAMUSCULAR | Status: AC
  Filled 2023-05-12: qty 30

## 2023-05-12 MED ORDER — EPHEDRINE 5 MG/ML INJ
INTRAVENOUS | Status: AC
  Filled 2023-05-12: qty 5

## 2023-05-12 MED ORDER — OXYCODONE HCL 5 MG PO TABS: 5.0000 mg | ORAL_TABLET | ORAL | Status: DC | PRN

## 2023-05-12 MED ORDER — PHENYLEPHRINE HCL (PRESSORS) 10 MG/ML IV SOLN
INTRAVENOUS | Status: DC | PRN
  Administered 2023-05-12: 80 ug via INTRAVENOUS

## 2023-05-12 MED ORDER — ORAL CARE MOUTH RINSE: 15.0000 mL | Freq: Once | OROMUCOSAL | Status: AC

## 2023-05-12 MED ORDER — MIDAZOLAM HCL 5 MG/5ML IJ SOLN
INTRAMUSCULAR | Status: DC | PRN
  Administered 2023-05-12: 2 mg via INTRAVENOUS

## 2023-05-12 MED ORDER — FENTANYL CITRATE (PF) 100 MCG/2ML IJ SOLN
INTRAMUSCULAR | Status: DC | PRN
  Administered 2023-05-12 (×4): 50 ug via INTRAVENOUS

## 2023-05-12 MED ORDER — SUCCINYLCHOLINE CHLORIDE 200 MG/10ML IV SOSY
PREFILLED_SYRINGE | INTRAVENOUS | Status: AC
  Filled 2023-05-12: qty 10

## 2023-05-12 MED ORDER — BUPIVACAINE HCL (PF) 0.5 % IJ SOLN
INTRAMUSCULAR | Status: DC | PRN
  Administered 2023-05-12: 30 mL

## 2023-05-12 MED ORDER — ONDANSETRON HCL 4 MG/2ML IJ SOLN
INTRAMUSCULAR | Status: AC
  Filled 2023-05-12: qty 2

## 2023-05-12 SURGICAL SUPPLY — 58 items
ADH SKN CLS APL DERMABOND .7 (GAUZE/BANDAGES/DRESSINGS) ×1
APL PRP STRL LF DISP 70% ISPRP (MISCELLANEOUS) ×1
CANNULA REDUCER 12-8 DVNC XI (CANNULA) ×1 IMPLANT
CHLORAPREP W/TINT 26 (MISCELLANEOUS) ×1 IMPLANT
COVER MAYO STAND XLG (MISCELLANEOUS) ×1 IMPLANT
COVER TIP SHEARS 8 DVNC (MISCELLANEOUS) ×1 IMPLANT
DEFOGGER SCOPE WARMER CLEARIFY (MISCELLANEOUS) IMPLANT
DERMABOND ADVANCED .7 DNX12 (GAUZE/BANDAGES/DRESSINGS) ×1 IMPLANT
DRAPE ARM DVNC X/XI (DISPOSABLE) ×3 IMPLANT
DRAPE COLUMN DVNC XI (DISPOSABLE) ×1 IMPLANT
ELECT REM PT RETURN 9FT ADLT (ELECTROSURGICAL) ×1
ELECTRODE REM PT RTRN 9FT ADLT (ELECTROSURGICAL) ×1 IMPLANT
FORCEPS BPLR FENES DVNC XI (FORCEP) ×1 IMPLANT
GAUZE 4X4 16PLY ~~LOC~~+RFID DBL (SPONGE) ×1 IMPLANT
GLOVE BIOGEL PI IND STRL 7.0 (GLOVE) ×2 IMPLANT
GLOVE SURG SS PI 7.5 STRL IVOR (GLOVE) ×2 IMPLANT
GOWN STRL REUS W/TWL LRG LVL3 (GOWN DISPOSABLE) ×3 IMPLANT
IRRIGATOR SUCT 8 DISP DVNC XI (IRRIGATION / IRRIGATOR) IMPLANT
IV NS IRRIG 3000ML ARTHROMATIC (IV SOLUTION) IMPLANT
KIT PINK PAD W/HEAD ARE REST (MISCELLANEOUS) ×1 IMPLANT
KIT PINK PAD W/HEAD ARM REST (MISCELLANEOUS) ×1 IMPLANT
KIT TURNOVER KIT A (KITS) ×1 IMPLANT
MANIFOLD NEPTUNE II (INSTRUMENTS) ×1 IMPLANT
NDL HYPO 21X1.5 SAFETY (NEEDLE) ×1 IMPLANT
NDL HYPO 25X1 1.5 SAFETY (NEEDLE) ×1 IMPLANT
NDL INSUFFLATION 14GA 120MM (NEEDLE) ×1 IMPLANT
NEEDLE HYPO 21X1.5 SAFETY (NEEDLE) ×1 IMPLANT
NEEDLE HYPO 25X1 1.5 SAFETY (NEEDLE) ×1 IMPLANT
NEEDLE INSUFFLATION 14GA 120MM (NEEDLE) ×1 IMPLANT
NS IRRIG 500ML POUR BTL (IV SOLUTION) ×1 IMPLANT
OBTURATOR OPTICAL STND 8 DVNC (TROCAR) ×1
OBTURATOR OPTICALSTD 8 DVNC (TROCAR) ×1 IMPLANT
PACK LAP CHOLE LZT030E (CUSTOM PROCEDURE TRAY) ×1 IMPLANT
PAD ARMBOARD 7.5X6 YLW CONV (MISCELLANEOUS) ×1 IMPLANT
PENCIL HANDSWITCHING (ELECTRODE) ×1 IMPLANT
POSITIONER HEAD 8X9X4 ADT (SOFTGOODS) ×1 IMPLANT
RELOAD STAPLE 45 2.5 WHT DVNC (STAPLE) IMPLANT
RELOAD STAPLE 45 3.5 BLU DVNC (STAPLE) IMPLANT
RELOAD STAPLE 45 4.3 GRN DVNC (STAPLE) IMPLANT
RELOAD STAPLER 2.5X45 WHT DVNC (STAPLE) IMPLANT
RELOAD STAPLER 3.5X45 BLU DVNC (STAPLE) ×1 IMPLANT
RELOAD STAPLER 4.3X45 GRN DVNC (STAPLE) ×1 IMPLANT
SCISSORS MNPLR CVD DVNC XI (INSTRUMENTS) ×1 IMPLANT
SEAL CANN UNIV 5-8 DVNC XI (MISCELLANEOUS) ×2 IMPLANT
SEALER VESSEL EXT DVNC XI (MISCELLANEOUS) IMPLANT
SET TUBE SMOKE EVAC HIGH FLOW (TUBING) ×1 IMPLANT
STAPLER 45 SUREFORM DVNC (STAPLE) ×1 IMPLANT
STAPLER CANNULA SEAL DVNC XI (STAPLE) ×1 IMPLANT
STAPLER RELOAD 2.5X45 WHT DVNC (STAPLE)
STAPLER RELOAD 3.5X45 BLU DVNC (STAPLE) ×1
STAPLER RELOAD 4.3X45 GRN DVNC (STAPLE) ×1
SUT MNCRL AB 4-0 PS2 18 (SUTURE) ×1 IMPLANT
SUT VICRYL 0 UR6 27IN ABS (SUTURE) IMPLANT
SYS BAG RETRIEVAL 10MM (BASKET) ×1
SYSTEM BAG RETRIEVAL 10MM (BASKET) IMPLANT
TAPE TRANSPORE STRL 2 31045 (GAUZE/BANDAGES/DRESSINGS) ×1 IMPLANT
TRAY FOLEY MTR SLVR 16FR STAT (SET/KITS/TRAYS/PACK) ×1 IMPLANT
WATER STERILE IRR 500ML POUR (IV SOLUTION) ×1 IMPLANT

## 2023-05-12 NOTE — Progress Notes (Signed)
Patient has discharge orders, discharge teaching given and no further questions at this time, Pt wheeled down to main entrance to vehicle accompanied by family.

## 2023-05-12 NOTE — Discharge Summary (Signed)
Physician Discharge Summary  Patient ID: Sean Harding MRN: [id number] DOB/AGE: October 06, 1996 27 y.o.  Admit date: 05/11/2023 Discharge date: 05/12/2023  Admission Diagnoses: Acute appendicitis  Discharge Diagnoses: Same Principal Problem:   Acute appendicitis   Discharged Condition: good  Hospital Course: Patient is a 27 year old black male who presented to the emergency room on 05/11/2023 with worsening right lower quadrant abdominal pain.  CT scan of the abdomen revealed acute appendicitis.  The patient underwent a robotic assisted laparoscopic appendectomy on 05/12/2023.  He tolerated the surgery well.  His postoperative course has been unremarkable.  His diet was advanced without difficulty.  The patient is being discharged home on 05/12/2023 in good and improving condition.   Treatments: surgery: Robotic assisted laparoscopic appendectomy on 05/12/2023  Discharge Exam: Blood pressure 129/81, pulse 75, temperature 98.6 F (37 C), temperature source Axillary, resp. rate 16, height 5\' 5"  (1.651 m), weight 68 kg, SpO2 98%. General appearance: alert, cooperative, and no distress Resp: clear to auscultation bilaterally Cardio: regular rate and rhythm, S1, S2 normal, no murmur, click, rub or gallop GI: Soft, incisions healing well.  Disposition: Discharge disposition: 01-Home or Self Care       Discharge Instructions     Diet - low sodium heart healthy   Complete by: As directed    Increase activity slowly   Complete by: As directed       Allergies as of 05/12/2023       Reactions   Bee Venom Swelling        Medication List     TAKE these medications    amphetamine-dextroamphetamine 15 MG 24 hr capsule Commonly known as: ADDERALL XR Take 15 mg by mouth every morning.   COLACE PO Take 1 tablet by mouth daily as needed (constipation).   montelukast 10 MG tablet Commonly known as: SINGULAIR Take 10 mg by mouth at bedtime.   oxyCODONE 5 MG immediate release  tablet Commonly known as: Roxicodone Take 1 tablet (5 mg total) by mouth every 4 (four) hours as needed for severe pain.   polyethylene glycol 17 g packet Commonly known as: MIRALAX / GLYCOLAX Take 17 g by mouth daily as needed for mild constipation.        Follow-up Information     [name], MD Follow up.   Specialty: General Surgery Why: As needed.  Will call you next week for follow up. Contact information: [address] [place] [address] [phone]                 Signed: [name] 05/12/2023, 6:17 PM

## 2023-05-12 NOTE — Interval H&P Note (Signed)
History and Physical Interval Note:  05/12/2023 12:24 PM  Sean Harding  has presented today for surgery, with the diagnosis of acute appendicitis.  The various methods of treatment have been discussed with the patient and family. After consideration of risks, benefits and other options for treatment, the patient has consented to  Procedure(s): XI ROBOTIC LAPAROSCOPIC ASSISTED APPENDECTOMY (N/A) as a surgical intervention.  The patient's history has been reviewed, patient examined, no change in status, stable for surgery.  I have reviewed the patient's chart and labs.  Questions were answered to the patient's satisfaction.     [name]

## 2023-05-12 NOTE — ED Provider Notes (Signed)
[place] EMERGENCY DEPARTMENT AT [facility] Provider Note   CSN: [id number] Arrival date & time: 05/11/23  2320     History  Chief Complaint  Patient presents with   Abdominal Pain    Sean Harding is a 27 y.o. male.  Presents to the emergency department for evaluation of abdominal pain.  Symptoms present for couple of days.  Patient has felt constipated, has used laxatives without improvement.  Pain is diffuse across the mid abdomen, sometimes right, sometimes left.  No vomiting.  No fever.       Home Medications Prior to Admission medications   Medication Sig Start Date End Date Taking? Authorizing Provider  methocarbamol (ROBAXIN) 500 MG tablet Take 2 tablets (1,000 mg total) by mouth 4 (four) times daily as needed for muscle spasms (muscle spasm/pain). 06/28/17   Samuel Jester, DO  naproxen (NAPROSYN) 250 MG tablet Take 1 tablet (250 mg total) by mouth 2 (two) times daily as needed for mild pain or moderate pain (take with food). 06/28/17   Samuel Jester, DO      Allergies    Bee venom    Review of Systems   Review of Systems  Physical Exam Updated Vital Signs BP 130/82   Pulse 68   Temp 98.6 F (37 C) (Oral)   Resp 16   Ht 5\' 5"  (1.651 m)   SpO2 99%   BMI 23.30 kg/m  Physical Exam Vitals and nursing note reviewed.  Constitutional:      General: He is not in acute distress.    Appearance: He is well-developed.  HENT:     Head: Normocephalic and atraumatic.     Mouth/Throat:     Mouth: Mucous membranes are moist.  Eyes:     General: Vision grossly intact. Gaze aligned appropriately.     Extraocular Movements: Extraocular movements intact.     Conjunctiva/sclera: Conjunctivae normal.  Cardiovascular:     Rate and Rhythm: Normal rate and regular rhythm.     Pulses: Normal pulses.     Heart sounds: Normal heart sounds, S1 normal and S2 normal. No murmur heard.    No friction rub. No gallop.  Pulmonary:     Effort: Pulmonary  effort is normal. No respiratory distress.     Breath sounds: Normal breath sounds.  Abdominal:     Palpations: Abdomen is soft.     Tenderness: There is abdominal tenderness in the right lower quadrant. There is no guarding or rebound.     Hernia: No hernia is present.  Musculoskeletal:        General: No swelling.     Cervical back: Full passive range of motion without pain, normal range of motion and neck supple. No pain with movement, spinous process tenderness or muscular tenderness. Normal range of motion.     Right lower leg: No edema.     Left lower leg: No edema.  Skin:    General: Skin is warm and dry.     Capillary Refill: Capillary refill takes less than 2 seconds.     Findings: No ecchymosis, erythema, lesion or wound.  Neurological:     Mental Status: He is alert and oriented to person, place, and time.     GCS: GCS eye subscore is 4. GCS verbal subscore is 5. GCS motor subscore is 6.     Cranial Nerves: Cranial nerves 2-12 are intact.     Sensory: Sensation is intact.     Motor: Motor function is  intact. No weakness or abnormal muscle tone.     Coordination: Coordination is intact.  Psychiatric:        Mood and Affect: Mood normal.        Speech: Speech normal.        Behavior: Behavior normal.     ED Results / Procedures / Treatments   Labs (all labs ordered are listed, but only abnormal results are displayed) Labs Reviewed  CBC WITH DIFFERENTIAL/PLATELET - Abnormal; Notable for the following components:      Result Value   Neutro Abs 8.1 (*)    All other components within normal limits  COMPREHENSIVE METABOLIC PANEL - Abnormal; Notable for the following components:   Glucose, Bld 110 (*)    All other components within normal limits  LIPASE, BLOOD    EKG None  Radiology CT ABDOMEN PELVIS W CONTRAST  Result Date: 05/12/2023 CLINICAL DATA:  Abdominal pain, acute, nonlocalized EXAM: CT ABDOMEN AND PELVIS WITH CONTRAST TECHNIQUE: Multidetector CT imaging  of the abdomen and pelvis was performed using the standard protocol following bolus administration of intravenous contrast. RADIATION DOSE REDUCTION: This exam was performed according to the departmental dose-optimization program which includes automated exposure control, adjustment of the mA and/or kV according to patient size and/or use of iterative reconstruction technique. CONTRAST:  OMNIPAQUE IOHEXOL 300 MG/ML  SOLN COMPARISON:  None Available. FINDINGS: Lower chest: No acute abnormality. Hepatobiliary: Scattered tiny cysts within the liver. No enhancing intrahepatic mass. No intra or extrahepatic biliary ductal dilation. Gallbladder unremarkable. Pancreas: Unremarkable Spleen: Unremarkable Adrenals/Urinary Tract: Adrenal glands are unremarkable. Kidneys are normal, without renal calculi, focal lesion, or hydronephrosis. Bladder is unremarkable. Stomach/Bowel: The appendix is dilated, fluid-filled, and markedly hyperemic with mild periappendiceal inflammatory stranding identified all in keeping with acute, unruptured appendicitis. The appendix is seen posterior 0 inferior to the cecum along the right pelvic sidewall. There is shotty ileocecal mesenteric adenopathy, likely reactive in nature. Mild relative hyperemia of the terminal ileum is present, likely related to the adjacent inflammatory process involving the appendix. There is no evidence of obstruction or perforation. No free intraperitoneal gas or fluid. No loculated periappendiceal fluid collections. The stomach, small bowel, and large bowel are otherwise unremarkable. Vascular/Lymphatic: No significant vascular findings are present. No enlarged abdominal or pelvic lymph nodes. Reproductive: Prostate is unremarkable. Other: No abdominal wall hernia Musculoskeletal: No acute or significant osseous findings. IMPRESSION: 1. Acute, unruptured appendicitis. No evidence of obstruction or perforation. No loculated periappendiceal fluid collections.  Electronically Signed   By: Helyn Numbers M.D.   On: 05/12/2023 01:53    Procedures Procedures    Medications Ordered in ED Medications  iohexol (OMNIPAQUE) 300 MG/ML solution 100 mL (100 mLs Intravenous Contrast Given 05/12/23 0136)    ED Course/ Medical Decision Making/ A&P                             Medical Decision Making Amount and/or Complexity of Data Reviewed Labs: ordered. Decision-making details documented in ED Course. Radiology: ordered and independent interpretation performed. Decision-making details documented in ED Course.  Risk Prescription drug management.   Differential Diagnosis considered includes, but not limited to: Appendicitis; colitis; diverticulitis; bowel obstruction; cystitis; nephrolithiasis; pyelonephritis.   Presents with 2 days of sensation of constipation, lower abdominal pain.  Patient with tenderness in the lower abdomen, right greater than left.  Lab work unremarkable.  CT scan consistent with acute uncomplicated appendicitis.  Final Clinical Impression(s) / ED Diagnoses Final diagnoses:  Acute appendicitis, unspecified acute appendicitis type    Rx / DC Orders ED Discharge Orders     None         Gilda Crease, MD 05/12/23 0205

## 2023-05-12 NOTE — Anesthesia Postprocedure Evaluation (Signed)
Anesthesia Post Note  Patient: Sean Harding  Procedure(s) Performed: XI ROBOTIC LAPAROSCOPIC ASSISTED APPENDECTOMY (Abdomen)  Patient location during evaluation: Phase II Anesthesia Type: General Level of consciousness: awake and alert and oriented Pain management: pain level controlled Vital Signs Assessment: post-procedure vital signs reviewed and stable Respiratory status: spontaneous breathing, nonlabored ventilation and respiratory function stable Cardiovascular status: blood pressure returned to baseline and stable Postop Assessment: no apparent nausea or vomiting Anesthetic complications: no  No notable events documented.   Last Vitals:  Vitals:   05/12/23 1500 05/12/23 1506  BP: (!) 123/52   Pulse: 91 93  Resp: 17 16  Temp:    SpO2: 99% 95%    Last Pain:  Vitals:   05/12/23 1506  TempSrc:   PainSc: 0-No pain                 Kaylean Tupou C Jolita Haefner

## 2023-05-12 NOTE — Consult Note (Signed)
Reason for Consult: Appendicitis  Referring Physician: Dr. Gustavus Bryant Sean Harding is an 27 y.o. male.  HPI: Sean Harding is a 27 yo male with a PMHx of ADHD and seasonal allergies presenting for RLQ abdominal pain. The pain started 1 day ago and felt crampy as if he needed to go to the bathroom. The pain progressively became more intense which prompted him to come to [facility]. He rated the pain initially an 8/10, crampy sensation that spread to his middle lower abdomen as well. The pain is constant, but periodically intensifies. He tried colace and MiraLax, but that did not help the pain. He had a small BM as a result, but was not a "normal" BM for him. His last normal BM was Friday. Moving around intensifies the pain and he tries to lay still and not move.   He reports that his pain improved to a 5/10 with morphine.   He denied headache, chest pain, shortness of breath, nausea, vomiting, diarrhea, and fevers. He endorsed decreased appetite and one episode of dysuria.   He reports that he takes a medication for ADHD that he does not know the name of and the last time he took the medication was 1 week ago. Additionally, he reports that he smokes marijuana occasionally and that the last time was either Friday or Saturday. He reports occasional alcohol use as well.   He has not had any surgery, including abdominal surgery previously.   No past medical history on file.  No past surgical history on file.  Family History  Problem Relation Age of Onset   Heart attack Other        Great Aunt: death/late 40's   Heart attack Other        Great Grandmother: death/late 40's    Social History:  reports that he has been smoking. He has never used smokeless tobacco. He reports current alcohol use. He reports that he does not use drugs.  Allergies:  Allergies  Allergen Reactions   Bee Venom Swelling    Medications: I have reviewed the patient's current medications.  Results for orders  placed or performed during the hospital encounter of 05/11/23 (from the past 48 hour(s))  CBC with Differential/Platelet     Status: Abnormal   Collection Time: 05/12/23  1:01 AM  Result Value Ref Range   WBC 9.7 4.0 - 10.5 K/uL   RBC 4.51 4.22 - 5.81 MIL/uL   Hemoglobin 13.8 13.0 - 17.0 g/dL   HCT 95.6 21.3 - 08.6 %   MCV 91.6 80.0 - 100.0 fL   MCH 30.6 26.0 - 34.0 pg   MCHC 33.4 30.0 - 36.0 g/dL   RDW 57.8 46.9 - 62.9 %   Platelets 275 150 - 400 K/uL   nRBC 0.0 0.0 - 0.2 %   Neutrophils Relative % 83 %   Neutro Abs 8.1 (H) 1.7 - 7.7 K/uL   Lymphocytes Relative 9 %   Lymphs Abs 0.9 0.7 - 4.0 K/uL   Monocytes Relative 6 %   Monocytes Absolute 0.5 0.1 - 1.0 K/uL   Eosinophils Relative 2 %   Eosinophils Absolute 0.2 0.0 - 0.5 K/uL   Basophils Relative 0 %   Basophils Absolute 0.0 0.0 - 0.1 K/uL   Immature Granulocytes 0 %   Abs Immature Granulocytes 0.02 0.00 - 0.07 K/uL    Comment: Performed at [facility], [address], [place], [place] [address]  Comprehensive metabolic panel     Status:  Abnormal   Collection Time: 05/12/23  1:01 AM  Result Value Ref Range   Sodium 136 135 - 145 mmol/L   Potassium 3.6 3.5 - 5.1 mmol/L   Chloride 101 98 - 111 mmol/L   CO2 26 22 - 32 mmol/L   Glucose, Bld 110 (H) 70 - 99 mg/dL    Comment: Glucose reference range applies only to samples taken after fasting for at least 8 hours.   BUN 11 6 - 20 mg/dL   Creatinine, Ser 0.98 0.61 - 1.24 mg/dL   Calcium 9.4 8.9 - 11.9 mg/dL   Total Protein 7.8 6.5 - 8.1 g/dL   Albumin 4.3 3.5 - 5.0 g/dL   AST 23 15 - 41 U/L   ALT 11 0 - 44 U/L   Alkaline Phosphatase 67 38 - 126 U/L   Total Bilirubin 0.9 0.3 - 1.2 mg/dL   GFR, Estimated >14 >78 mL/min    Comment: (NOTE) Calculated using the CKD-EPI Creatinine Equation (2021)    Anion gap 9 5 - 15    Comment: Performed at [facility], [address], [place], [place] [address]  Lipase, blood     Status: None   Collection Time: 05/12/23  1:01 AM  Result  Value Ref Range   Lipase 24 11 - 51 U/L    Comment: Performed at [facility], [address] [place], [place] [address]    CT ABDOMEN PELVIS W CONTRAST  Result Date: 05/12/2023 CLINICAL DATA:  Abdominal pain, acute, nonlocalized EXAM: CT ABDOMEN AND PELVIS WITH CONTRAST TECHNIQUE: Multidetector CT imaging of the abdomen and pelvis was performed using the standard protocol following bolus administration of intravenous contrast. RADIATION DOSE REDUCTION: This exam was performed according to the departmental dose-optimization program which includes automated exposure control, adjustment of the mA and/or kV according to patient size and/or use of iterative reconstruction technique. CONTRAST:  OMNIPAQUE IOHEXOL 300 MG/ML  SOLN COMPARISON:  None Available. FINDINGS: Lower chest: No acute abnormality. Hepatobiliary: Scattered tiny cysts within the liver. No enhancing intrahepatic mass. No intra or extrahepatic biliary ductal dilation. Gallbladder unremarkable. Pancreas: Unremarkable Spleen: Unremarkable Adrenals/Urinary Tract: Adrenal glands are unremarkable. Kidneys are normal, without renal calculi, focal lesion, or hydronephrosis. Bladder is unremarkable. Stomach/Bowel: The appendix is dilated, fluid-filled, and markedly hyperemic with mild periappendiceal inflammatory stranding identified all in keeping with acute, unruptured appendicitis. The appendix is seen posterior 0 inferior to the cecum along the right pelvic sidewall. There is shotty ileocecal mesenteric adenopathy, likely reactive in nature. Mild relative hyperemia of the terminal ileum is present, likely related to the adjacent inflammatory process involving the appendix. There is no evidence of obstruction or perforation. No free intraperitoneal gas or fluid. No loculated periappendiceal fluid collections. The stomach, small bowel, and large bowel are otherwise unremarkable. Vascular/Lymphatic: No significant vascular findings are present. No  enlarged abdominal or pelvic lymph nodes. Reproductive: Prostate is unremarkable. Other: No abdominal wall hernia Musculoskeletal: No acute or significant osseous findings. IMPRESSION: 1. Acute, unruptured appendicitis. No evidence of obstruction or perforation. No loculated periappendiceal fluid collections. Electronically Signed   By: Helyn Numbers M.D.   On: 05/12/2023 01:53    ROS:  Pertinent items noted in HPI and remainder of comprehensive ROS otherwise negative.  Blood pressure 123/76, pulse 71, temperature 97.9 F (36.6 C), temperature source Oral, resp. rate 16, height 5\' 5"  (1.651 m), SpO2 99%. Physical Exam:   General: Lying in bed in no acute distress Respiratory: Normal work of breathing Cardio: Regular rate.  GI: Soft, non-distended abdomen. Tender to palpation in the RLQ. No rebound.    Assessment/Plan: Sean Harding presents with RLQ abdominal pain and acute, non-perforated appendicitis on CT. His vital signs are stable and he is afebrile. His WBC count is wnl. His pain is currently well-controlled with PRN morphine. He has been NPO since arrival to [facility]. He has had preoperative ceftriaxone and metronidazole. General surgery has scheduled him for an appendectomy today.   Camille Bal 05/12/2023, 7:21 AM

## 2023-05-12 NOTE — Anesthesia Procedure Notes (Signed)
Procedure Name: Intubation Date/Time: 05/12/2023 12:37 PM  Performed by: Moshe Salisbury, CRNAPre-anesthesia Checklist: Patient identified, Patient being monitored, Timeout performed, Emergency Drugs available and Suction available Patient Re-evaluated:Patient Re-evaluated prior to induction Oxygen Delivery Method: Circle system utilized Preoxygenation: Pre-oxygenation with 100% oxygen Induction Type: IV induction Ventilation: Mask ventilation without difficulty Laryngoscope Size: Mac and 3 Grade View: Grade I Tube type: Oral Tube size: 7.0 mm Number of attempts: 1 Airway Equipment and Method: Stylet Placement Confirmation: ETT inserted through vocal cords under direct vision, positive ETCO2 and breath sounds checked- equal and bilateral Secured at: 21 cm Tube secured with: Tape Dental Injury: Teeth and Oropharynx as per pre-operative assessment

## 2023-05-12 NOTE — H&P (View-Only) (Signed)
Reason for Consult: Appendicitis  Referring Physician: Dr. Gustavus Bryant Sean Harding is an 27 y.o. male.  HPI: Sean Harding is a 27 yo male with a PMHx of ADHD and seasonal allergies presenting for RLQ abdominal pain. The pain started 1 day ago and felt crampy as if he needed to go to the bathroom. The pain progressively became more intense which prompted him to come to [facility]. He rated the pain initially an 8/10, crampy sensation that spread to his middle lower abdomen as well. The pain is constant, but periodically intensifies. He tried colace and MiraLax, but that did not help the pain. He had a small BM as a result, but was not a "normal" BM for him. His last normal BM was Friday. Moving around intensifies the pain and he tries to lay still and not move.   He reports that his pain improved to a 5/10 with morphine.   He denied headache, chest pain, shortness of breath, nausea, vomiting, diarrhea, and fevers. He endorsed decreased appetite and one episode of dysuria.   He reports that he takes a medication for ADHD that he does not know the name of and the last time he took the medication was 1 week ago. Additionally, he reports that he smokes marijuana occasionally and that the last time was either Friday or Saturday. He reports occasional alcohol use as well.   He has not had any surgery, including abdominal surgery previously.   No past medical history on file.  No past surgical history on file.  Family History  Problem Relation Age of Onset   Heart attack Other        Great Aunt: death/late 40's   Heart attack Other        Great Grandmother: death/late 40's    Social History:  reports that he has been smoking. He has never used smokeless tobacco. He reports current alcohol use. He reports that he does not use drugs.  Allergies:  Allergies  Allergen Reactions   Bee Venom Swelling    Medications: I have reviewed the patient's current medications.  Results for orders  placed or performed during the hospital encounter of 05/11/23 (from the past 48 hour(s))  CBC with Differential/Platelet     Status: Abnormal   Collection Time: 05/12/23  1:01 AM  Result Value Ref Range   WBC 9.7 4.0 - 10.5 K/uL   RBC 4.51 4.22 - 5.81 MIL/uL   Hemoglobin 13.8 13.0 - 17.0 g/dL   HCT 16.1 09.6 - 04.5 %   MCV 91.6 80.0 - 100.0 fL   MCH 30.6 26.0 - 34.0 pg   MCHC 33.4 30.0 - 36.0 g/dL   RDW 40.9 81.1 - 91.4 %   Platelets 275 150 - 400 K/uL   nRBC 0.0 0.0 - 0.2 %   Neutrophils Relative % 83 %   Neutro Abs 8.1 (H) 1.7 - 7.7 K/uL   Lymphocytes Relative 9 %   Lymphs Abs 0.9 0.7 - 4.0 K/uL   Monocytes Relative 6 %   Monocytes Absolute 0.5 0.1 - 1.0 K/uL   Eosinophils Relative 2 %   Eosinophils Absolute 0.2 0.0 - 0.5 K/uL   Basophils Relative 0 %   Basophils Absolute 0.0 0.0 - 0.1 K/uL   Immature Granulocytes 0 %   Abs Immature Granulocytes 0.02 0.00 - 0.07 K/uL    Comment: Performed at [facility], [address], [place], [place] [address]  Comprehensive metabolic panel     Status:  Abnormal   Collection Time: 05/12/23  1:01 AM  Result Value Ref Range   Sodium 136 135 - 145 mmol/L   Potassium 3.6 3.5 - 5.1 mmol/L   Chloride 101 98 - 111 mmol/L   CO2 26 22 - 32 mmol/L   Glucose, Bld 110 (H) 70 - 99 mg/dL    Comment: Glucose reference range applies only to samples taken after fasting for at least 8 hours.   BUN 11 6 - 20 mg/dL   Creatinine, Ser 1.61 0.61 - 1.24 mg/dL   Calcium 9.4 8.9 - 09.6 mg/dL   Total Protein 7.8 6.5 - 8.1 g/dL   Albumin 4.3 3.5 - 5.0 g/dL   AST 23 15 - 41 U/L   ALT 11 0 - 44 U/L   Alkaline Phosphatase 67 38 - 126 U/L   Total Bilirubin 0.9 0.3 - 1.2 mg/dL   GFR, Estimated >04 >54 mL/min    Comment: (NOTE) Calculated using the CKD-EPI Creatinine Equation (2021)    Anion gap 9 5 - 15    Comment: Performed at [facility], [address], [place], [place] [address]  Lipase, blood     Status: None   Collection Time: 05/12/23  1:01 AM  Result  Value Ref Range   Lipase 24 11 - 51 U/L    Comment: Performed at [facility], [address] [place], [place] [address]    CT ABDOMEN PELVIS W CONTRAST  Result Date: 05/12/2023 CLINICAL DATA:  Abdominal pain, acute, nonlocalized EXAM: CT ABDOMEN AND PELVIS WITH CONTRAST TECHNIQUE: Multidetector CT imaging of the abdomen and pelvis was performed using the standard protocol following bolus administration of intravenous contrast. RADIATION DOSE REDUCTION: This exam was performed according to the departmental dose-optimization program which includes automated exposure control, adjustment of the mA and/or kV according to patient size and/or use of iterative reconstruction technique. CONTRAST:  OMNIPAQUE IOHEXOL 300 MG/ML  SOLN COMPARISON:  None Available. FINDINGS: Lower chest: No acute abnormality. Hepatobiliary: Scattered tiny cysts within the liver. No enhancing intrahepatic mass. No intra or extrahepatic biliary ductal dilation. Gallbladder unremarkable. Pancreas: Unremarkable Spleen: Unremarkable Adrenals/Urinary Tract: Adrenal glands are unremarkable. Kidneys are normal, without renal calculi, focal lesion, or hydronephrosis. Bladder is unremarkable. Stomach/Bowel: The appendix is dilated, fluid-filled, and markedly hyperemic with mild periappendiceal inflammatory stranding identified all in keeping with acute, unruptured appendicitis. The appendix is seen posterior 0 inferior to the cecum along the right pelvic sidewall. There is shotty ileocecal mesenteric adenopathy, likely reactive in nature. Mild relative hyperemia of the terminal ileum is present, likely related to the adjacent inflammatory process involving the appendix. There is no evidence of obstruction or perforation. No free intraperitoneal gas or fluid. No loculated periappendiceal fluid collections. The stomach, small bowel, and large bowel are otherwise unremarkable. Vascular/Lymphatic: No significant vascular findings are present. No  enlarged abdominal or pelvic lymph nodes. Reproductive: Prostate is unremarkable. Other: No abdominal wall hernia Musculoskeletal: No acute or significant osseous findings. IMPRESSION: 1. Acute, unruptured appendicitis. No evidence of obstruction or perforation. No loculated periappendiceal fluid collections. Electronically Signed   By: Helyn Numbers M.D.   On: 05/12/2023 01:53    ROS:  Pertinent items noted in HPI and remainder of comprehensive ROS otherwise negative.  Blood pressure 123/76, pulse 71, temperature 97.9 F (36.6 C), temperature source Oral, resp. rate 16, height 5\' 5"  (1.651 m), SpO2 99%. Physical Exam:   General: Lying in bed in no acute distress Respiratory: Normal work of breathing Cardio: Regular rate.  GI: Soft, non-distended abdomen. Tender to palpation in the RLQ. No rebound.    Assessment/Plan: Sean Harding presents with RLQ abdominal pain and acute, non-perforated appendicitis on CT. His vital signs are stable and he is afebrile. His WBC count is wnl. His pain is currently well-controlled with PRN morphine. He has been NPO since arrival to [facility]. He has had preoperative ceftriaxone and metronidazole. General surgery has scheduled him for an appendectomy today.   Camille Bal 05/12/2023, 7:21 AM

## 2023-05-12 NOTE — Transfer of Care (Signed)
Immediate Anesthesia Transfer of Care Note  Patient: Sean Harding  Procedure(s) Performed: XI ROBOTIC LAPAROSCOPIC ASSISTED APPENDECTOMY (Abdomen)  Patient Location: PACU  Anesthesia Type:General  Level of Consciousness: awake, alert , oriented, and patient cooperative  Airway & Oxygen Therapy: Patient Spontanous Breathing and Patient connected to face mask oxygen  Post-op Assessment: Report given to RN, Post -op Vital signs reviewed and stable, and Patient moving all extremities X 4  Post vital signs: Reviewed and stable  Last Vitals:  Vitals Value Taken Time  BP 129/55 05/12/23 1445  Temp 97.6   Pulse 88 05/12/23 1450  Resp 20 05/12/23 1450  SpO2 100 % 05/12/23 1450  Vitals shown include unfiled device data.  Last Pain:  Vitals:   05/12/23 1152  TempSrc: Oral  PainSc: 6       Patients Stated Pain Goal: 2 (05/12/23 1152)  Complications: No notable events documented.

## 2023-05-12 NOTE — Op Note (Signed)
Patient:  Sean Harding  DOB:  October 11, 1996  MRN:  [id number]   Preop Diagnosis: Acute appendicitis  Postop Diagnosis: Same  Procedure: Robotic assisted laparoscopic appendectomy  Surgeon: [name], MD  Anes: General endotracheal  Indications: Patient is a 27 year old black male who presents with right lower quadrant abdominal pain.  CT scan of the abdomen reveals acute appendicitis.  The risks and benefits of the procedure including bleeding, infection, and the possibility of an open procedure were fully explained to the patient, who gave informed consent.  Procedure note: The patient was placed in the supine position.  After induction of general endotracheal anesthesia, the abdomen was prepped and draped using the usual sterile technique with ChloraPrep.  Surgical site confirmation was performed.  An incision was made at Palmer's point.  A Veress needle was introduced into the abdominal cavity and confirmation of placement was done using the saline drop test.  The abdomen was then insufflated to 15 mmHg pressure.  An 8 mm trocar was introduced into the abdominal cavity under direct visualization without difficulty.  An additional 8 mm trocar was placed in the left flank region and left lower quadrant regions under direct visualization.  The left upper quadrant 8 mm trocar was then upsized to a 12 mm trocar.  The right lower quadrant was inspected and there was significant edema and inflamed tissue in the right lower quadrant.  No evidence of perforation was noted.  I did have to mobilize the cecum in order to visualize the appendix.  The appendix was diffusely inflamed.  It was difficult to see the base.  Using the vessel sealer, the mesoappendix was divided.  Initially, a green load Endo GIA was placed across the appendix and fired.  I then did further dissection around the appendiceal stump and a blue load was used to come across the stump.  The right lower quadrant was evacuated of  any fluid.  The appendix was removed using an Endo Catch bag without difficulty.  The robot was then undocked and all air was evacuated from the abdominal cavity prior to removal of trocars.  All wounds were irrigated with normal saline.  All wounds were injected with point 5% Sensorcaine.  The 12 mm trocar site fascia was reapproximated using an 0 Vicryl interrupted suture.  All skin incisions were closed using a 4-0 Monocryl subcuticular suture.  Dermabond was applied.  All tape and needle counts were correct at the end of the procedure.  The patient was extubated in the operating room and transferred to PACU in stable condition.  Complications: None  EBL: Minimal  Specimen: Appendix

## 2023-05-12 NOTE — Anesthesia Preprocedure Evaluation (Addendum)
Anesthesia Evaluation  Patient identified by MRN, date of birth, ID band Patient awake    Reviewed: Allergy & Precautions, H&P , NPO status , Patient's Chart, lab work & pertinent test results  Airway Mallampati: II  TM Distance: >3 FB Neck ROM: Full    Dental no notable dental hx. (+) Dental Advisory Given, Teeth Intact   Pulmonary asthma , Current Smoker and Patient abstained from smoking.   Pulmonary exam normal breath sounds clear to auscultation       Cardiovascular negative cardio ROS Normal cardiovascular exam Rhythm:Regular Rate:Normal     Neuro/Psych negative neurological ROS  negative psych ROS   GI/Hepatic negative GI ROS, Neg liver ROS,,,  Endo/Other  negative endocrine ROS    Renal/GU negative Renal ROS  negative genitourinary   Musculoskeletal negative musculoskeletal ROS (+)    Abdominal   Peds  (+) ADHD Hematology negative hematology ROS (+)   Anesthesia Other Findings   Reproductive/Obstetrics negative OB ROS                             Anesthesia Physical Anesthesia Plan  ASA: 2  Anesthesia Plan: General   Post-op Pain Management: Dilaudid IV   Induction: Intravenous  PONV Risk Score and Plan: 3 and Ondansetron, Dexamethasone and Midazolam  Airway Management Planned: Oral ETT  Additional Equipment:   Intra-op Plan:   Post-operative Plan: Extubation in OR  Informed Consent: I have reviewed the patients History and Physical, chart, labs and discussed the procedure including the risks, benefits and alternatives for the proposed anesthesia with the patient or authorized representative who has indicated his/her understanding and acceptance.     Dental advisory given  Plan Discussed with: CRNA and Surgeon  Anesthesia Plan Comments:        Anesthesia Quick Evaluation

## 2023-05-14 ENCOUNTER — Encounter (HOSPITAL_COMMUNITY): Payer: Self-pay | Admitting: General Surgery

## 2023-05-14 LAB — SURGICAL PATHOLOGY

## 2023-05-21 ENCOUNTER — Encounter: Payer: Self-pay | Admitting: General Surgery

## 2023-05-21 ENCOUNTER — Ambulatory Visit (INDEPENDENT_AMBULATORY_CARE_PROVIDER_SITE_OTHER): Payer: Commercial Managed Care - HMO | Admitting: General Surgery

## 2023-05-21 DIAGNOSIS — K353 Acute appendicitis with localized peritonitis, without perforation or gangrene: Secondary | ICD-10-CM

## 2023-05-21 DIAGNOSIS — Z09 Encounter for follow-up examination after completed treatment for conditions other than malignant neoplasm: Secondary | ICD-10-CM

## 2023-05-21 NOTE — Progress Notes (Signed)
Subjective:     Sean Harding  Telephone postoperative visit performed with patient.  Patient is status post robotic assisted laparoscopic appendectomy.  [place].  He states he is doing well.  He has minimal incisional pain. Objective:    There were no vitals taken for this visit.  General:  alert, cooperative, and no distress  Final pathology consistent with diagnosis.     Assessment:    Doing well postoperatively.    Plan:   Patient may resume normal activity as able.  Follow-up here as needed.  Total telephone time was 2 minutes.  As this was a part of the global surgical fee, this was not a billable visit.

## 2023-09-09 ENCOUNTER — Other Ambulatory Visit (HOSPITAL_BASED_OUTPATIENT_CLINIC_OR_DEPARTMENT_OTHER): Payer: Self-pay

## 2023-09-09 MED ORDER — AMPHETAMINE-DEXTROAMPHET ER 15 MG PO CP24
15.0000 mg | ORAL_CAPSULE | Freq: Every morning | ORAL | 0 refills | Status: AC
  Filled 2023-09-09: qty 30, 30d supply, fill #0
